# Patient Record
Sex: Female | Born: 1995
Health system: Southern US, Community
[De-identification: ages and names within clinical notes are randomized; demographics above are authoritative.]

---
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
## 2017-05-09 DIAGNOSIS — Z020 Encounter for examination for admission to educational institution: Secondary | ICD-10-CM | POA: Diagnosis not present

## 2017-05-09 DIAGNOSIS — J309 Allergic rhinitis, unspecified: Secondary | ICD-10-CM | POA: Diagnosis not present

## 2017-05-09 DIAGNOSIS — L700 Acne vulgaris: Secondary | ICD-10-CM | POA: Diagnosis not present

## 2017-05-16 DIAGNOSIS — Z23 Encounter for immunization: Secondary | ICD-10-CM | POA: Diagnosis not present

## 2017-06-07 DIAGNOSIS — D229 Melanocytic nevi, unspecified: Secondary | ICD-10-CM | POA: Diagnosis not present

## 2017-06-07 DIAGNOSIS — L720 Epidermal cyst: Secondary | ICD-10-CM | POA: Diagnosis not present

## 2017-06-07 DIAGNOSIS — L709 Acne, unspecified: Secondary | ICD-10-CM | POA: Diagnosis not present

## 2017-06-28 DIAGNOSIS — Z0184 Encounter for antibody response examination: Secondary | ICD-10-CM | POA: Diagnosis not present

## 2018-08-01 DIAGNOSIS — Z20828 Contact with and (suspected) exposure to other viral communicable diseases: Secondary | ICD-10-CM | POA: Diagnosis not present

## 2019-09-13 ENCOUNTER — Other Ambulatory Visit: Payer: Self-pay | Admitting: Family Medicine

## 2019-09-13 ENCOUNTER — Other Ambulatory Visit (HOSPITAL_COMMUNITY)
Admission: RE | Admit: 2019-09-13 | Discharge: 2019-09-13 | Disposition: A | Discharge status: Home / Self Care | Payer: 59 | Source: Ambulatory Visit | Attending: Family Medicine | Admitting: Family Medicine

## 2019-09-13 DIAGNOSIS — Z124 Encounter for screening for malignant neoplasm of cervix: Secondary | ICD-10-CM | POA: Diagnosis present

## 2019-09-17 LAB — CYTOLOGY - PAP
Adequacy: ABSENT
Diagnosis: NEGATIVE

## 2019-11-05 ENCOUNTER — Other Ambulatory Visit: Payer: 59

## 2019-11-05 ENCOUNTER — Other Ambulatory Visit: Payer: Self-pay

## 2019-11-05 DIAGNOSIS — Z20822 Contact with and (suspected) exposure to covid-19: Secondary | ICD-10-CM

## 2019-11-07 LAB — SARS-COV-2, NAA 2 DAY TAT

## 2019-11-07 LAB — NOVEL CORONAVIRUS, NAA: SARS-CoV-2, NAA: NOT DETECTED

## 2020-10-10 LAB — HM PAP SMEAR: HM Pap smear: NEGATIVE

## 2021-07-31 ENCOUNTER — Encounter (HOSPITAL_COMMUNITY): Payer: Self-pay | Admitting: Emergency Medicine

## 2021-07-31 ENCOUNTER — Emergency Department (HOSPITAL_COMMUNITY)
Admission: EM | Admit: 2021-07-31 | Discharge: 2021-07-31 | Disposition: A | Discharge status: Home / Self Care | Payer: 59 | Attending: Emergency Medicine | Admitting: Emergency Medicine

## 2021-07-31 ENCOUNTER — Other Ambulatory Visit: Payer: Self-pay

## 2021-07-31 DIAGNOSIS — S6991XA Unspecified injury of right wrist, hand and finger(s), initial encounter: Secondary | ICD-10-CM | POA: Diagnosis present

## 2021-07-31 DIAGNOSIS — W268XXA Contact with other sharp object(s), not elsewhere classified, initial encounter: Secondary | ICD-10-CM | POA: Insufficient documentation

## 2021-07-31 DIAGNOSIS — S61212A Laceration without foreign body of right middle finger without damage to nail, initial encounter: Secondary | ICD-10-CM | POA: Insufficient documentation

## 2021-07-31 MED ORDER — TETANUS-DIPHTH-ACELL PERTUSSIS 5-2.5-18.5 LF-MCG/0.5 IM SUSY: 0.5000 mL | PREFILLED_SYRINGE | Freq: Once | INTRAMUSCULAR | Status: DC

## 2021-07-31 NOTE — ED Triage Notes (Signed)
Pt reported to ED for evaluation of injury to right third finger after accidentally cutting tip while using a slicing device at home. Some bleeding noted to site at time of triage. Pt able to flex and extend all digits on affected hand with no difficulty.

## 2021-07-31 NOTE — ED Provider Notes (Signed)
[facility] EMERGENCY DEPARTMENT Provider Note   CSN: [id number] Arrival date & time: 07/31/21  1951     History  Chief Complaint  Patient presents with   Finger Injury    Cynthia Williamson is a 26 y.o. female with no significant past medical history who is not up-to-date on tetanus who presents with concern for slicing very tip of right third finger with a mandolin.  Patient reports there was a lot of bleeding that she did not want to do.  Father attempted to place a liquid bandage with no success.  Endorses no nail injury, numbness, tingling.  HPI     Home Medications Prior to Admission medications   Not on File      Allergies    Sulfa antibiotics    Review of Systems   Review of Systems  Skin:  Positive for wound.  All other systems reviewed and are negative.   Physical Exam Updated Vital Signs BP 122/84   Pulse 86   Temp 98.4 F (36.9 C) (Oral)   Resp 18   SpO2 100%  Physical Exam Vitals and nursing note reviewed.  Constitutional:      General: She is not in acute distress.    Appearance: Normal appearance.  HENT:     Head: Normocephalic and atraumatic.  Eyes:     General:        Right eye: No discharge.        Left eye: No discharge.  Cardiovascular:     Rate and Rhythm: Normal rate and regular rhythm.     Pulses: Normal pulses.  Pulmonary:     Effort: Pulmonary effort is normal. No respiratory distress.  Musculoskeletal:        General: No deformity.  Skin:    General: Skin is warm and dry.     Capillary Refill: Capillary refill takes less than 2 seconds.     Comments: Very shallow laceration at tip of right third finger with no nailbed involvement.  No active bleeding at time my evaluation.  No signs of foreign body.  There is a total removal of very thin skin surface, no repairable laceration noted.  Neurological:     Mental Status: She is alert and oriented to person, place, and time.  Psychiatric:        Mood and Affect:  Mood normal.        Behavior: Behavior normal.     ED Results / Procedures / Treatments   Labs (all labs ordered are listed, but only abnormal results are displayed) Labs Reviewed - No data to display  EKG None  Radiology No results found.  Procedures Procedures    Medications Ordered in ED Medications  Tdap (BOOSTRIX) injection 0.5 mL (has no administration in time range)    ED Course/ Medical Decision Making/ A&P                           Medical Decision Making  This an overall well-appearing 26 year old female who presents with concern for slicing off the tip of her third finger on the right side.  On exam patient has very shallow complete removal of the tip of her third right finger.  There is no nailbed involvement.  No active bleeding at time my evaluation.  Discussed with patient that wound is likely to heal by secondary intention.  We will update her tetanus shot.  We will apply Xeroform dressing, patient instructed to  keep the dressing in place for 48 hours, for redressing at home.  Patient given information of hand surgeon for follow-up as needed for wound care.  She is discharged in stable condition at this time with some wound care supplies.  Encouraged to monitor for signs of infection.  She is neurovascularly intact throughout. Final Clinical Impression(s) / ED Diagnoses Final diagnoses:  Laceration of right middle finger without foreign body without damage to nail, initial encounter    Rx / DC Orders ED Discharge Orders     None         Olene Floss, PA-C 07/31/21 2016    Tanda Rockers A, DO 08/01/21 [id number]

## 2021-07-31 NOTE — Discharge Instructions (Signed)
Keep the dressing that we have placed today on for 48 hours before redressing.  Clean the wound thoroughly after dressing, apply thin layer of Polysporin, Neosporin, and then redress again, change dressing every 24 hours until wound completely healed.  Monitor for signs of infection including worsening redness, swelling, purulent drainage.  Please return to the emergency department if you are concerned about possible infection.  As this wound is very shallow I think that it would heal fine on its own with no intervention, however if you are concerned about the weight is healing, without signs of infection you can follow-up with a hand surgeon for further wound care instructions.

## 2022-04-06 ENCOUNTER — Other Ambulatory Visit: Payer: Self-pay

## 2022-04-06 ENCOUNTER — Emergency Department (HOSPITAL_BASED_OUTPATIENT_CLINIC_OR_DEPARTMENT_OTHER): Payer: Self-pay

## 2022-04-06 ENCOUNTER — Other Ambulatory Visit (HOSPITAL_BASED_OUTPATIENT_CLINIC_OR_DEPARTMENT_OTHER): Payer: Self-pay

## 2022-04-06 ENCOUNTER — Encounter (HOSPITAL_BASED_OUTPATIENT_CLINIC_OR_DEPARTMENT_OTHER): Payer: Self-pay | Admitting: Emergency Medicine

## 2022-04-06 ENCOUNTER — Emergency Department (HOSPITAL_BASED_OUTPATIENT_CLINIC_OR_DEPARTMENT_OTHER)
Admission: EM | Admit: 2022-04-06 | Discharge: 2022-04-06 | Disposition: A | Discharge status: Home / Self Care | Payer: Self-pay | Attending: Emergency Medicine | Admitting: Emergency Medicine

## 2022-04-06 DIAGNOSIS — R1013 Epigastric pain: Secondary | ICD-10-CM | POA: Insufficient documentation

## 2022-04-06 DIAGNOSIS — D649 Anemia, unspecified: Secondary | ICD-10-CM | POA: Insufficient documentation

## 2022-04-06 DIAGNOSIS — R519 Headache, unspecified: Secondary | ICD-10-CM | POA: Insufficient documentation

## 2022-04-06 DIAGNOSIS — R109 Unspecified abdominal pain: Secondary | ICD-10-CM

## 2022-04-06 DIAGNOSIS — R1031 Right lower quadrant pain: Secondary | ICD-10-CM | POA: Insufficient documentation

## 2022-04-06 LAB — CBC WITH DIFFERENTIAL/PLATELET
Abs Immature Granulocytes: 0.02 10*3/uL (ref 0.00–0.07)
Basophils Absolute: 0 10*3/uL (ref 0.0–0.1)
Basophils Relative: 0 %
Eosinophils Absolute: 0 10*3/uL (ref 0.0–0.5)
Eosinophils Relative: 0 %
HCT: 36.4 % (ref 36.0–46.0)
Hemoglobin: 11.5 g/dL — ABNORMAL LOW (ref 12.0–15.0)
Immature Granulocytes: 0 %
Lymphocytes Relative: 22 %
Lymphs Abs: 1.1 10*3/uL (ref 0.7–4.0)
MCH: 25.7 pg — ABNORMAL LOW (ref 26.0–34.0)
MCHC: 31.6 g/dL (ref 30.0–36.0)
MCV: 81.3 fL (ref 80.0–100.0)
Monocytes Absolute: 0.5 10*3/uL (ref 0.1–1.0)
Monocytes Relative: 9 %
Neutro Abs: 3.4 10*3/uL (ref 1.7–7.7)
Neutrophils Relative %: 69 %
Platelets: 346 10*3/uL (ref 150–400)
RBC: 4.48 MIL/uL (ref 3.87–5.11)
RDW: 16.3 % — ABNORMAL HIGH (ref 11.5–15.5)
WBC: 5 10*3/uL (ref 4.0–10.5)
nRBC: 0 % (ref 0.0–0.2)

## 2022-04-06 LAB — URINALYSIS, ROUTINE W REFLEX MICROSCOPIC
Bilirubin Urine: NEGATIVE
Glucose, UA: NEGATIVE mg/dL
Hgb urine dipstick: NEGATIVE
Leukocytes,Ua: NEGATIVE
Nitrite: NEGATIVE
Protein, ur: 30 mg/dL — AB
Specific Gravity, Urine: 1.032 — ABNORMAL HIGH (ref 1.005–1.030)
pH: 6.5 (ref 5.0–8.0)

## 2022-04-06 LAB — COMPREHENSIVE METABOLIC PANEL
ALT: 11 U/L (ref 0–44)
AST: 19 U/L (ref 15–41)
Albumin: 4 g/dL (ref 3.5–5.0)
Alkaline Phosphatase: 45 U/L (ref 38–126)
Anion gap: 9 (ref 5–15)
BUN: 9 mg/dL (ref 6–20)
CO2: 27 mmol/L (ref 22–32)
Calcium: 9.2 mg/dL (ref 8.9–10.3)
Chloride: 100 mmol/L (ref 98–111)
Creatinine, Ser: 0.85 mg/dL (ref 0.44–1.00)
GFR, Estimated: >60 mL/min (ref >60)
Glucose, Bld: 73 mg/dL (ref 70–99)
Potassium: 3.5 mmol/L (ref 3.5–5.1)
Sodium: 136 mmol/L (ref 135–145)
Total Bilirubin: 0.5 mg/dL (ref 0.3–1.2)
Total Protein: 7.9 g/dL (ref 6.5–8.1)

## 2022-04-06 LAB — LIPASE, BLOOD: Lipase: <10 U/L — ABNORMAL LOW (ref 11–51)

## 2022-04-06 LAB — PREGNANCY, URINE: Preg Test, Ur: NEGATIVE

## 2022-04-06 MED ORDER — MORPHINE SULFATE (PF) 4 MG/ML IV SOLN
6.0000 mg | Freq: Once | INTRAVENOUS | Status: AC
  Administered 2022-04-06: 6 mg via INTRAVENOUS
  Filled 2022-04-06: qty 2

## 2022-04-06 MED ORDER — KETOROLAC TROMETHAMINE 30 MG/ML IJ SOLN: 30.0000 mg | Freq: Once | INTRAMUSCULAR | Status: DC

## 2022-04-06 MED ORDER — ONDANSETRON HCL 4 MG/2ML IJ SOLN
4.0000 mg | Freq: Once | INTRAMUSCULAR | Status: AC
  Administered 2022-04-06: 4 mg via INTRAVENOUS
  Filled 2022-04-06: qty 2

## 2022-04-06 MED ORDER — IOHEXOL 300 MG/ML  SOLN
100.0000 mL | Freq: Once | INTRAMUSCULAR | Status: AC | PRN
  Administered 2022-04-06: 100 mL via INTRAVENOUS

## 2022-04-06 MED ORDER — KETOROLAC TROMETHAMINE 30 MG/ML IJ SOLN
30.0000 mg | Freq: Once | INTRAMUSCULAR | Status: AC
  Administered 2022-04-06: 30 mg via INTRAVENOUS
  Filled 2022-04-06: qty 1

## 2022-04-06 MED ORDER — SODIUM CHLORIDE 0.9 % IV BOLUS
1000.0000 mL | Freq: Once | INTRAVENOUS | Status: AC
  Administered 2022-04-06: 1000 mL via INTRAVENOUS

## 2022-04-06 NOTE — ED Triage Notes (Addendum)
Pt states started feeling bad on Sunday. Has throbbing abd pain/ cramping. Took tylenol and slept. Has had diarrhea ever since. She states she is not really eating due to fear of stomach hurting. Has not taken anything for it- except zofran. Pt is drinking water. She states mother also has little bit of diarrhea but not the pain or lightheadedness when standing.

## 2022-04-06 NOTE — ED Provider Notes (Signed)
[facility] Provider Note   CSN: [id number] Arrival date & time: 04/06/22  1221     History  Chief Complaint  Patient presents with   Abdominal Pain   HPI Cynthia Williamson is a 27 y.o. female presenting for abdominal pain.  Started Sunday night.  Pain located in the epigastric region.  It is constant, nonradiating and feels like pressure.  States she has had some nausea and diarrhea started yesterday.  Diarrhea is nonbloody.  No vomiting.  Denies urinary changes.  Denies recent alcohol consumption.  Has been taking Tylenol as well for symptoms.  Also endorsing headache that started Sunday night as well.  Headache is all over her head.  It is not the worst headache she ever had in her life but has been persistent.  States she is still able to tolerate fluid intake but her appetite has been poor in the last 48 hours.   Abdominal Pain      Home Medications Prior to Admission medications   Not on File      Allergies    Sulfa antibiotics    Review of Systems   Review of Systems  Gastrointestinal:  Positive for abdominal pain.    Physical Exam   Vitals:   04/06/22 1229  BP: 126/79  Pulse: 98  Resp: 16  Temp: 98.2 F (36.8 C)  SpO2: 98%    CONSTITUTIONAL:  well-appearing, NAD NEURO:  GCS 15. Speech is goal oriented. No deficits appreciated to CN III-XII; symmetric eyebrow raise, no facial drooping, tongue midline. Patient has equal grip strength bilaterally with 5/5 strength against resistance in all major muscle groups bilaterally. Sensation to light touch intact. Patient moves extremities without ataxia. Normal finger-nose-finger. Patient ambulatory with steady gait. EYES:  eyes equal and reactive ENT/NECK:  Supple, no stridor, No JVD CARDIO:  regular rate and rhythm, appears well-perfused  PULM:  No respiratory distress, CTAB GI/GU:  non-distended, epigastric and right lower quadrant tenderness MSK/SPINE:  No gross  deformities, no edema, moves all extremities  SKIN:  no rash, atraumatic   *Additional and/or pertinent findings included in MDM below   ED Results / Procedures / Treatments   Labs (all labs ordered are listed, but only abnormal results are displayed) Labs Reviewed  CBC WITH DIFFERENTIAL/PLATELET - Abnormal; Notable for the following components:      Result Value   Hemoglobin 11.5 (*)    MCH 25.7 (*)    RDW 16.3 (*)    All other components within normal limits  LIPASE, BLOOD - Abnormal; Notable for the following components:   Lipase <10 (*)    All other components within normal limits  URINALYSIS, ROUTINE W REFLEX MICROSCOPIC - Abnormal; Notable for the following components:   APPearance HAZY (*)    Specific Gravity, Urine 1.032 (*)    Ketones, ur TRACE (*)    Protein, ur 30 (*)    Bacteria, UA RARE (*)    All other components within normal limits  COMPREHENSIVE METABOLIC PANEL  PREGNANCY, URINE    EKG EKG Interpretation  Date/Time:  Tuesday April 06 2022 13:22:48 EST Ventricular Rate:  84 PR Interval:  142 QRS Duration: 82 QT Interval:  321 QTC Calculation: 380 R Axis:   61 Text Interpretation: Sinus rhythm Normal ECG Confirmed by Blanchie Dessert [phone]) on 04/06/2022 1:36:49 PM  Radiology CT Abdomen Pelvis W Contrast  Result Date: 04/06/2022 CLINICAL DATA:  Abdominal pain, diarrhea EXAM: CT ABDOMEN AND PELVIS WITH CONTRAST TECHNIQUE:  Multidetector CT imaging of the abdomen and pelvis was performed using the standard protocol following bolus administration of intravenous contrast. RADIATION DOSE REDUCTION: This exam was performed according to the departmental dose-optimization program which includes automated exposure control, adjustment of the mA and/or kV according to patient size and/or use of iterative reconstruction technique. CONTRAST:  179m OMNIPAQUE IOHEXOL 300 MG/ML  SOLN COMPARISON:  None Available. FINDINGS: Lower chest: Small linear densities in left lower  lung fields may suggest minimal scarring. Small pericardial effusion is present. Hepatobiliary: No focal abnormalities are seen in liver. Gallbladder is unremarkable. Pancreas: No focal abnormalities are seen. Spleen: Unremarkable. Adrenals/Urinary Tract: Adrenals are unremarkable. There is no hydronephrosis. There are no renal or ureteral stones. Urinary bladder is not distended. Stomach/Bowel: Stomach is unremarkable. There is mild to moderate dilation of a small bowel loop in left lower abdomen. The dilated loop measures up to 3.3 cm in diameter. There is fluid in the lumen. There is no significant focal wall thickening. Appendix is not dilated. There is no significant wall thickening in colon. Vascular/Lymphatic: No significant lymphadenopathy is seen. Reproductive: Uterus is unremarkable. Small amount of free fluid is seen in cul-de-sac. Other: There is no pneumoperitoneum. Small umbilical hernia containing fat is seen. Musculoskeletal: No acute findings are seen. IMPRESSION: There is no evidence of intestinal obstruction or pneumoperitoneum. Appendix is not dilated. There is no hydronephrosis. There is dilation of small bowel loop in left lower abdomen measuring up to 3.3 cm in diameter. There is fluid in the lumen of this dilated small bowel loops. Findings may suggest localized ileus or nonspecific enteritis. Small amount of free fluid in pelvis may be due to physiological rupture of ovarian follicle or related to enteritis. Small pericardial effusion. Other findings as described in the body of the report. Electronically Signed   By: PElmer PickerM.D.   On: 04/06/2022 14:59    Procedures Procedures    Medications Ordered in ED Medications  ketorolac (TORADOL) 30 MG/ML injection 30 mg (has no administration in time range)  sodium chloride 0.9 % bolus 1,000 mL (0 mLs Intravenous Stopped 04/06/22 1424)  ondansetron (ZOFRAN) injection 4 mg (4 mg Intravenous Given 04/06/22 1325)  morphine (PF) 4  MG/ML injection 6 mg (6 mg Intravenous Given 04/06/22 1322)  iohexol (OMNIPAQUE) 300 MG/ML solution 100 mL (100 mLs Intravenous Contrast Given 04/06/22 1441)    ED Course/ Medical Decision Making/ A&P                             Medical Decision Making Amount and/or Complexity of Data Reviewed Labs: ordered. Radiology: ordered.  Risk Prescription drug management.   Initial Impression and Ddx 27yo who is well appearing and HD stable presenting for abdominal pain. Exam notable for LLQ tenderness. Ddx, includes SBO, bowel perforation, diverticulitis, appendicitis, ACS, ectopic pregnancy. Ddx for headache,   Patient PMH that increases complexity of ED encounter:  none  Interpretation of Diagnostics I independent reviewed and interpreted the labs as followed: anemia  - I independently visualized the following imaging with scope of interpretation limited to determining acute life threatening conditions related to emergency care: CT abdomen and pelvis, which revealed small pericardial effusion, findings suggestive of enteritis versus small ileus  -I personally reviewed and interpreted EKG which revealed normal sinus rhythm  Patient Reassessment and Ultimate Disposition/Management Volume resuscitated with normal saline bolus.  Treated abdominal pain and headache with morphine and Toradol.  Treated nausea with  Zofran.  After treatment patient states she felt much better.  Was concerned primarily for appendicitis given her RLQ tenderness but fortunately CT scan was negative.  Did reveal findings of a small pericardial effusion and findings suggestive of enteritis versus small ileus.  Shared these findings with patient.  Have low suspicion for cardiac tamponade given that patient is not hypotensive, there is no JVD, and heart sounds are not distant.  Advised her to follow-up with her PCP for ongoing abdominal pain and incidental findings on CT scan.  Headache likely mild migraine or tension type  headache.  Symptoms are consistent with [facility] or hypertensive emergency.  Patient management required discussion with the following services or consulting groups:  None  Complexity of Problems Addressed Acute complicated illness or Injury  Additional Data Reviewed and Analyzed Further history obtained from: Past medical history and medications listed in the EMR  Patient Encounter Risk Assessment None         Final Clinical Impression(s) / ED Diagnoses Final diagnoses:  Abdominal pain, unspecified abdominal location  Nonintractable headache, unspecified chronicity pattern, unspecified headache type    Rx / DC Orders ED Discharge Orders     None         Harriet Pho, PA-C 04/06/22 1540    Blanchie Dessert, MD 04/07/22 2200

## 2022-04-06 NOTE — Discharge Instructions (Signed)
Evaluation today for headache and abdominal pain was overall reassuring.  CT of your abdomen and pelvis was overall reassuring.  Recommend that you follow-up with your PCP for the incidental finding of the small pericardial effusion and findings suggestive of ileus versus enteritis.  At this time nothing to treat but follow-up is warranted pressure.  If you have worsening abdominal pain, new chest pain or shortness of breath, or any other concerning symptom please return emergency department further evaluation.

## 2022-05-06 ENCOUNTER — Encounter: Payer: Self-pay | Admitting: Family Medicine

## 2022-05-06 ENCOUNTER — Ambulatory Visit (INDEPENDENT_AMBULATORY_CARE_PROVIDER_SITE_OTHER): Payer: 59 | Admitting: Family Medicine

## 2022-05-06 VITALS — BP 124/82 | HR 86 | Temp 98.2°F | Resp 18 | Ht 65.35 in | Wt 352.0 lb

## 2022-05-06 DIAGNOSIS — Z1329 Encounter for screening for other suspected endocrine disorder: Secondary | ICD-10-CM

## 2022-05-06 DIAGNOSIS — Z113 Encounter for screening for infections with a predominantly sexual mode of transmission: Secondary | ICD-10-CM | POA: Diagnosis not present

## 2022-05-06 DIAGNOSIS — Z7689 Persons encountering health services in other specified circumstances: Secondary | ICD-10-CM

## 2022-05-06 DIAGNOSIS — Z6841 Body Mass Index (BMI) 40.0 and over, adult: Secondary | ICD-10-CM | POA: Insufficient documentation

## 2022-05-06 DIAGNOSIS — J309 Allergic rhinitis, unspecified: Secondary | ICD-10-CM | POA: Insufficient documentation

## 2022-05-06 DIAGNOSIS — Z1159 Encounter for screening for other viral diseases: Secondary | ICD-10-CM | POA: Diagnosis not present

## 2022-05-06 DIAGNOSIS — Z130 Encounter for screening for diseases of the blood and blood-forming organs and certain disorders involving the immune mechanism: Secondary | ICD-10-CM

## 2022-05-06 DIAGNOSIS — Z13228 Encounter for screening for other metabolic disorders: Secondary | ICD-10-CM | POA: Diagnosis not present

## 2022-05-06 NOTE — Progress Notes (Signed)
New Patient Office Visit  Subjective    Patient ID: Cynthia Williamson, female    DOB: 1995/08/22  Age: 27 y.o. MRN: [id number]  CC:  Chief Complaint  Patient presents with   Establish Care    HPI Cynthia Williamson presents to establish care.  Cynthia Williamson does not have any significant medical history, although Cynthia Williamson was in the hospital at beginning of March for abdominal pain with negative workup.  Cynthia Williamson has an allergy to pork.  Currently up-to-date on all vaccinations but declines flu and COVID, Pap smear up-to-date in 2022.  Family history positive for diabetes.  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  Her mom and her dad offer a good support system.  Cynthia Williamson does not follow any particular diet or exercise plan other than avoiding pork.  Cynthia Williamson is not currently sexually active.  No outpatient encounter medications on file as of 05/06/2022.   No facility-administered encounter medications on file as of 05/06/2022.    History reviewed. No pertinent past medical history.  History reviewed. No pertinent surgical history.  History reviewed. No pertinent family history.  Social History   Socioeconomic History   Marital status: Single    Spouse name: Not on file   Number of children: Not on file   Years of education: Not on file   Highest education level: Not on file  Occupational History   Occupation: Adjunct Professor    Employer: [employer]  Tobacco Use   Smoking status: Never    Passive exposure: Never   Smokeless tobacco: Never  Vaping Use   Vaping Use: Never used  Substance and Sexual Activity   Alcohol use: No   Drug use: No   Sexual activity: Never  Other Topics Concern   Not on file  Social History Narrative   Not on file   Social Determinants of Health   Financial Resource Strain: Not on file  Food Insecurity: Not on file  Transportation Needs: Not on file  Physical Activity: Not on file  Stress: Not on file  Social Connections: Not on  file  Intimate Partner Violence: Not on file    Review of Systems  Constitutional:  Negative for chills, fever and malaise/fatigue.  HENT:  Negative for congestion and hearing loss.   Eyes:  Negative for blurred vision and double vision.  Respiratory:  Negative for cough and shortness of breath.   Cardiovascular:  Negative for chest pain, palpitations and leg swelling.  Gastrointestinal:  Negative for abdominal pain, constipation, diarrhea and heartburn.  Genitourinary:  Negative for frequency and urgency.  Musculoskeletal:  Negative for myalgias and neck pain.  Neurological:  Negative for headaches.  Endo/Heme/Allergies:  Negative for polydipsia.  Psychiatric/Behavioral:  Negative for depression. The patient is not nervous/anxious.         Objective    BP 124/82 (BP Location: Left Arm, Patient Position: Sitting, Cuff Size: Large)   Pulse 86   Temp 98.2 F (36.8 C) (Oral)   Resp 18   Ht 5' 5.35" (1.66 m)   Wt (!) 352 lb (159.7 kg)   LMP 04/11/2022 (Exact Date)   SpO2 100%   BMI 57.94 kg/m   Physical Exam Constitutional:      General: Cynthia Williamson is not in acute distress.    Appearance: Normal appearance. Cynthia Williamson is not ill-appearing.  HENT:     Head: Normocephalic and atraumatic.     Right Ear: Tympanic membrane, ear canal and external  ear normal.     Left Ear: Tympanic membrane, ear canal and external ear normal.     Nose: Nose normal. No congestion or rhinorrhea.     Mouth/Throat:     Mouth: Mucous membranes are moist.     Pharynx: Oropharynx is clear. No oropharyngeal exudate or posterior oropharyngeal erythema.  Eyes:     General:        Right eye: No discharge.        Left eye: No discharge.     Conjunctiva/sclera: Conjunctivae normal.     Pupils: Pupils are equal, round, and reactive to light.  Cardiovascular:     Rate and Rhythm: Normal rate and regular rhythm.     Pulses: Normal pulses.     Heart sounds: No murmur heard.    No friction rub. No gallop.  Pulmonary:      Effort: Pulmonary effort is normal. No respiratory distress.     Breath sounds: Normal breath sounds. No wheezing, rhonchi or rales.  Musculoskeletal:     Cervical back: Normal range of motion.  Skin:    General: Skin is warm and dry.  Neurological:     General: No focal deficit present.     Mental Status: Cynthia Williamson is alert and oriented to person, place, and time. Mental status is at baseline.  Psychiatric:        Mood and Affect: Mood normal.        Thought Content: Thought content normal.        Judgment: Judgment normal.        Assessment & Plan:  Encounter to establish care -     CBC; Future -     Comprehensive metabolic panel; Future -     Lipid panel; Future -     Hemoglobin A1c; Future -     Hepatitis C antibody; Future -     HIV Antibody (routine testing w rflx); Future  Screening for endocrine, metabolic and immunity disorder -     CBC; Future -     Comprehensive metabolic panel; Future -     Lipid panel; Future -     Hemoglobin A1c; Future -     Hepatitis C antibody; Future -     HIV Antibody (routine testing w rflx); Future  Screening for viral disease -     Hepatitis C antibody; Future -     HIV Antibody (routine testing w rflx); Future  Routine screening for STI (sexually transmitted infection) -     Chlamydia/Gonococcus/Trichomonas, NAA; Future    Return in about 2 months (around 07/06/2022) for annual physical.  If still asymptomatic, will continue with watchful waiting.  Otherwise, reasonable to consider referral to GI and/or cardiology given episode of abdominal pain and small pericardial effusion plus enteritis versus small ileus found on CT during hospital workup.  Patient verbalized understanding and is agreeable to this plan.  I spent 45 minutes on the day of the encounter to include pre-visit record review, face-to-face time with the patient and post visit ordering of test.  Velva Harman, PA

## 2022-07-09 ENCOUNTER — Encounter: Payer: 59 | Admitting: Family Medicine

## 2022-07-16 ENCOUNTER — Encounter: Payer: 59 | Admitting: Family Medicine

## 2022-07-27 ENCOUNTER — Encounter: Payer: Self-pay | Admitting: Family Medicine

## 2022-07-27 ENCOUNTER — Ambulatory Visit (INDEPENDENT_AMBULATORY_CARE_PROVIDER_SITE_OTHER): Payer: 59 | Admitting: Family Medicine

## 2022-07-27 VITALS — BP 127/79 | HR 85 | Resp 18 | Ht 65.35 in | Wt 352.0 lb

## 2022-07-27 DIAGNOSIS — Z1159 Encounter for screening for other viral diseases: Secondary | ICD-10-CM

## 2022-07-27 DIAGNOSIS — D649 Anemia, unspecified: Secondary | ICD-10-CM | POA: Diagnosis not present

## 2022-07-27 DIAGNOSIS — Z6841 Body Mass Index (BMI) 40.0 and over, adult: Secondary | ICD-10-CM | POA: Diagnosis not present

## 2022-07-27 DIAGNOSIS — Z0000 Encounter for general adult medical examination without abnormal findings: Secondary | ICD-10-CM | POA: Diagnosis not present

## 2022-07-27 NOTE — Progress Notes (Signed)
Complete physical exam  Patient: Cynthia Williamson   DOB: 1995/11/20   27 y.o. Female  MRN: [id number]  Subjective:    Chief Complaint  Patient presents with   Annual Exam    fasting    Cynthia Williamson is a 27 y.o. female who presents today for a complete physical exam. She reports consuming a general diet, avoiding pork due to her allergy. The patient does not participate in regular exercise at present. However, she does have her 48 year old nephew with her for the summer and stays busy. She generally feels well. She reports sleeping well. She does not have additional problems to discuss today. Her goal for the next year is to lose weight. Abdominal pain has resolved.   Most recent fall risk assessment:    07/27/2022    9:21 AM  Fall Risk   Falls in the past year? 0  Number falls in past yr: 0  Injury with Fall? 0  Risk for fall due to : No Fall Risks  Follow up Falls evaluation completed     Most recent depression and anxiety screenings:    07/27/2022    9:22 AM 05/06/2022    2:00 PM  PHQ 2/9 Scores  PHQ - 2 Score 0 0  PHQ- 9 Score 0       07/27/2022    9:22 AM 05/06/2022    2:00 PM  GAD 7 : Generalized Anxiety Score  Nervous, Anxious, on Edge 0 0  Control/stop worrying 0 0  Worry too much - different things 0 0  Trouble relaxing 0 0  Restless 0 0  Easily annoyed or irritable 0 0  Afraid - awful might happen 0 0  Total GAD 7 Score 0 0  Anxiety Difficulty Not difficult at all Not difficult at all    Patient Active Problem List   Diagnosis Date Noted   Body mass index (BMI) 50.0-59.9, adult (HCC) 05/06/2022   Allergic rhinitis 05/06/2022    History reviewed. No pertinent surgical history. Social History   Tobacco Use   Smoking status: Never    Passive exposure: Never   Smokeless tobacco: Never  Vaping Use   Vaping Use: Never used  Substance Use Topics   Alcohol use: No   Drug use: No   History reviewed. No pertinent family history. Allergies  Allergen  Reactions   Pork-Derived Products    Sulfa Antibiotics      Patient Care Team: Melida Quitter, PA as PCP - General (Family Medicine)   No outpatient medications prior to visit.   No facility-administered medications prior to visit.    Review of Systems  Constitutional:  Negative for chills, fever and malaise/fatigue.  HENT:  Negative for congestion and hearing loss.   Eyes:  Negative for blurred vision and double vision.  Respiratory:  Negative for cough and shortness of breath.   Cardiovascular:  Negative for chest pain, palpitations and leg swelling.  Gastrointestinal:  Negative for abdominal pain, constipation, diarrhea and heartburn.  Genitourinary:  Negative for frequency and urgency.  Musculoskeletal:  Negative for myalgias and neck pain.  Neurological:  Negative for headaches.  Endo/Heme/Allergies:  Negative for polydipsia.  Psychiatric/Behavioral:  Negative for depression. The patient is not nervous/anxious and does not have insomnia.       Objective:    BP 127/79 (BP Location: Left Arm, Patient Position: Sitting, Cuff Size: Large)   Pulse 85   Resp 18   Ht 5' 5.35" (1.66 m)  Wt (!) 352 lb (159.7 kg)   LMP 07/26/2022 (Exact Date)   SpO2 99%   BMI 57.95 kg/m    Physical Exam Constitutional:      General: She is not in acute distress.    Appearance: Normal appearance.  HENT:     Head: Normocephalic and atraumatic.     Right Ear: Tympanic membrane, ear canal and external ear normal. There is no impacted cerumen.     Left Ear: Tympanic membrane, ear canal and external ear normal. There is no impacted cerumen.     Nose: Nose normal. No congestion or rhinorrhea.     Mouth/Throat:     Mouth: Mucous membranes are moist.     Pharynx: No oropharyngeal exudate or posterior oropharyngeal erythema.  Eyes:     Extraocular Movements: Extraocular movements intact.     Conjunctiva/sclera: Conjunctivae normal.     Pupils: Pupils are equal, round, and reactive to  light.  Neck:     Thyroid: No thyroid mass, thyromegaly or thyroid tenderness.  Cardiovascular:     Rate and Rhythm: Normal rate and regular rhythm.     Heart sounds: Normal heart sounds.  Pulmonary:     Effort: Pulmonary effort is normal.     Breath sounds: Normal breath sounds.  Abdominal:     General: Abdomen is flat. Bowel sounds are normal.  Musculoskeletal:        General: Normal range of motion.     Cervical back: Normal range of motion and neck supple.  Lymphadenopathy:     Cervical: No cervical adenopathy.  Skin:    General: Skin is warm and dry.  Neurological:     Mental Status: She is alert and oriented to person, place, and time.     Cranial Nerves: No cranial nerve deficit.     Motor: No weakness.     Deep Tendon Reflexes: Reflexes normal.  Psychiatric:        Mood and Affect: Mood normal.       Assessment & Plan:    Routine Health Maintenance and Physical Exam  Immunization History  Administered Date(s) Administered   DTaP 09/03/1997, 11/04/1997, 12/18/1997, 09/16/1998, 03/22/2002   HIB (PRP-OMP) 09/03/1997, 11/04/1997, 09/16/1998, 03/22/2002   HIB (PRP-T) 09/03/1997, 11/04/1997, 09/16/1998, 03/22/2002   HPV 9-valent 07/14/2015, 09/15/2015, 01/15/2016   Hepatitis B, ADULT 1997/12/15, 07/23/1997, 03/19/1998   Hepatitis B, PED/ADOLESCENT 1997/05/03, 07/23/1997, 03/19/1998   MMR 06/16/1998, 03/22/2002   Meningococcal Conjugate 07/09/2013, 09/13/2019   OPV 09/03/1997, 11/04/1997, 03/19/1998, 03/22/2002   Td 05/16/2017   Tdap 09/04/2008, 09/13/2019   Varicella 06/16/1998, 09/04/2008, 05/16/2017    Health Maintenance  Topic Date Due   COVID-19 Vaccine (1) Never done   HIV Screening  Never done   Hepatitis C Screening  Never done   PAP-Cervical Cytology Screening  09/13/2022   PAP SMEAR-Modifier  09/13/2022   INFLUENZA VACCINE  09/02/2022   DTaP/Tdap/Td (9 - Td or Tdap) 09/12/2029   HPV VACCINES  Completed   Most recent pap at [facility] in 2022, requesting  copy of results. Will be due in 2025 at annual physical appt. Agreeable to completing HIV and hepatitis C screenings with labs today as they were not drawn after establishing care in April.  Discussed health benefits of physical activity, and encouraged her to engage in regular exercise appropriate for her age and condition.  Wellness examination -     CBC with Differential/Platelet; Future -     Comprehensive metabolic panel; Future -  Hemoglobin A1c; Future -     Lipid panel; Future -     VITAMIN D 25 Hydroxy (Vit-D Deficiency, Fractures); Future -     TSH; Future -     T4, free; Future  Body mass index (BMI) 50.0-59.9, adult (HCC) -     CBC with Differential/Platelet; Future -     Comprehensive metabolic panel; Future -     Hemoglobin A1c; Future -     Lipid panel; Future -     VITAMIN D 25 Hydroxy (Vit-D Deficiency, Fractures); Future -     TSH; Future -     T4, free; Future  Screening for viral disease -     Hepatitis C antibody; Future -     HIV Antibody (routine testing w rflx); Future  Anemia, unspecified type -     CBC with Differential/Platelet; Future -     Comprehensive metabolic panel; Future  Abdominal pain has resolved, continue watchful waiting. If needed in the future, can repeat imaging or refer to GI.  Return in about 1 year (around 07/27/2023) for annual physical + pap smear, fasting blood work 1 week before.     Melida Quitter, PA

## 2022-07-27 NOTE — Patient Instructions (Signed)
The Obesity Guide with [employer] is the awesome podcast that I was telling you about!  I hope that you enjoy listening to it. Please let me know if you have any questions or need any support, I am here to help you be the happiest, healthiest version of yourself :)

## 2022-07-28 ENCOUNTER — Other Ambulatory Visit: Payer: Self-pay | Admitting: Family Medicine

## 2022-07-28 ENCOUNTER — Other Ambulatory Visit (HOSPITAL_BASED_OUTPATIENT_CLINIC_OR_DEPARTMENT_OTHER): Payer: Self-pay

## 2022-07-28 DIAGNOSIS — E559 Vitamin D deficiency, unspecified: Secondary | ICD-10-CM

## 2022-07-28 DIAGNOSIS — D649 Anemia, unspecified: Secondary | ICD-10-CM

## 2022-07-28 LAB — LIPID PANEL
Chol/HDL Ratio: 4.1 ratio (ref 0.0–4.4)
Cholesterol, Total: 211 mg/dL — ABNORMAL HIGH (ref 100–199)
HDL: 52 mg/dL (ref >39)
LDL Chol Calc (NIH): 147 mg/dL — ABNORMAL HIGH (ref 0–99)
Triglycerides: 70 mg/dL (ref 0–149)
VLDL Cholesterol Cal: 12 mg/dL (ref 5–40)

## 2022-07-28 LAB — COMPREHENSIVE METABOLIC PANEL
ALT: 11 IU/L (ref 0–32)
AST: 18 IU/L (ref 0–40)
Albumin: 4.2 g/dL (ref 4.0–5.0)
Alkaline Phosphatase: 73 IU/L (ref 44–121)
BUN/Creatinine Ratio: 14 (ref 9–23)
BUN: 10 mg/dL (ref 6–20)
Bilirubin Total: <0.2 mg/dL (ref 0.0–1.2)
CO2: 20 mmol/L (ref 20–29)
Calcium: 9.9 mg/dL (ref 8.7–10.2)
Chloride: 103 mmol/L (ref 96–106)
Creatinine, Ser: 0.69 mg/dL (ref 0.57–1.00)
Globulin, Total: 2.8 g/dL (ref 1.5–4.5)
Glucose: 86 mg/dL (ref 70–99)
Potassium: 4.8 mmol/L (ref 3.5–5.2)
Sodium: 138 mmol/L (ref 134–144)
Total Protein: 7 g/dL (ref 6.0–8.5)
eGFR: 123 mL/min/{1.73_m2} (ref >59)

## 2022-07-28 LAB — CBC WITH DIFFERENTIAL/PLATELET
Basophils Absolute: 0 10*3/uL (ref 0.0–0.2)
Basos: 1 %
EOS (ABSOLUTE): 0.1 10*3/uL (ref 0.0–0.4)
Eos: 2 %
Hematocrit: 34.2 % (ref 34.0–46.6)
Hemoglobin: 10.7 g/dL — ABNORMAL LOW (ref 11.1–15.9)
Immature Grans (Abs): 0 10*3/uL (ref 0.0–0.1)
Immature Granulocytes: 0 %
Lymphocytes Absolute: 1.6 10*3/uL (ref 0.7–3.1)
Lymphs: 25 %
MCH: 25.1 pg — ABNORMAL LOW (ref 26.6–33.0)
MCHC: 31.3 g/dL — ABNORMAL LOW (ref 31.5–35.7)
MCV: 80 fL (ref 79–97)
Monocytes Absolute: 0.4 10*3/uL (ref 0.1–0.9)
Monocytes: 6 %
Neutrophils Absolute: 4.4 10*3/uL (ref 1.4–7.0)
Neutrophils: 66 %
Platelets: 435 10*3/uL (ref 150–450)
RBC: 4.27 x10E6/uL (ref 3.77–5.28)
RDW: 16.3 % — ABNORMAL HIGH (ref 11.7–15.4)
WBC: 6.6 10*3/uL (ref 3.4–10.8)

## 2022-07-28 LAB — TSH: TSH: 2.67 u[IU]/mL (ref 0.450–4.500)

## 2022-07-28 LAB — T4, FREE: Free T4: 1.23 ng/dL (ref 0.82–1.77)

## 2022-07-28 LAB — HEMOGLOBIN A1C
Est. average glucose Bld gHb Est-mCnc: 111 mg/dL
Hgb A1c MFr Bld: 5.5 % (ref 4.8–5.6)

## 2022-07-28 LAB — VITAMIN D 25 HYDROXY (VIT D DEFICIENCY, FRACTURES): Vit D, 25-Hydroxy: 7.8 ng/mL — ABNORMAL LOW (ref 30.0–100.0)

## 2022-07-28 LAB — HEPATITIS C ANTIBODY: Hep C Virus Ab: NONREACTIVE

## 2022-07-28 LAB — HIV ANTIBODY (ROUTINE TESTING W REFLEX): HIV Screen 4th Generation wRfx: NONREACTIVE

## 2022-07-28 MED ORDER — VITAMIN D (ERGOCALCIFEROL) 1.25 MG (50000 UNIT) PO CAPS
50000.0000 [IU] | ORAL_CAPSULE | ORAL | 0 refills | Status: DC
  Filled 2022-07-28: qty 4, 28d supply, fill #0

## 2022-08-02 ENCOUNTER — Encounter: Payer: Self-pay | Admitting: Family Medicine

## 2022-08-07 ENCOUNTER — Other Ambulatory Visit (HOSPITAL_BASED_OUTPATIENT_CLINIC_OR_DEPARTMENT_OTHER): Payer: Self-pay

## 2022-09-21 ENCOUNTER — Telehealth: Payer: Self-pay

## 2022-09-21 DIAGNOSIS — E559 Vitamin D deficiency, unspecified: Secondary | ICD-10-CM

## 2022-09-21 MED ORDER — VITAMIN D (ERGOCALCIFEROL) 1.25 MG (50000 UNIT) PO CAPS: 50000.0000 [IU] | ORAL_CAPSULE | ORAL | 0 refills | Status: AC

## 2022-09-21 NOTE — Telephone Encounter (Signed)
Script has been resent

## 2022-09-21 NOTE — Addendum Note (Signed)
Addended by: Tonny Bollman on: 09/21/2022 11:59 AM   Modules accepted: Orders

## 2022-09-21 NOTE — Telephone Encounter (Signed)
Pt is requesting that the Vit D rx be transferred to [id number] - [place], [place] - [address] [[id number]]   Pt never pick up that Rx.

## 2023-03-10 ENCOUNTER — Encounter: Payer: Self-pay | Admitting: Family Medicine

## 2023-07-28 ENCOUNTER — Encounter: Payer: 59 | Admitting: Family Medicine

## 2023-10-10 ENCOUNTER — Other Ambulatory Visit: Payer: Self-pay

## 2023-10-10 ENCOUNTER — Other Ambulatory Visit (HOSPITAL_BASED_OUTPATIENT_CLINIC_OR_DEPARTMENT_OTHER): Payer: Self-pay

## 2023-10-10 ENCOUNTER — Emergency Department (HOSPITAL_BASED_OUTPATIENT_CLINIC_OR_DEPARTMENT_OTHER)
Admission: EM | Admit: 2023-10-10 | Discharge: 2023-10-10 | Disposition: A | Discharge status: Home / Self Care | Attending: Emergency Medicine | Admitting: Emergency Medicine

## 2023-10-10 DIAGNOSIS — J029 Acute pharyngitis, unspecified: Secondary | ICD-10-CM | POA: Diagnosis not present

## 2023-10-10 DIAGNOSIS — R059 Cough, unspecified: Secondary | ICD-10-CM | POA: Diagnosis present

## 2023-10-10 LAB — RESP PANEL BY RT-PCR (RSV, FLU A&B, COVID)  RVPGX2
Influenza A by PCR: NEGATIVE
Influenza B by PCR: NEGATIVE
Resp Syncytial Virus by PCR: NEGATIVE
SARS Coronavirus 2 by RT PCR: NEGATIVE

## 2023-10-10 LAB — GROUP A STREP BY PCR: Group A Strep by PCR: NOT DETECTED

## 2023-10-10 MED ORDER — BENZONATATE 100 MG PO CAPS
100.0000 mg | ORAL_CAPSULE | Freq: Three times a day (TID) | ORAL | 0 refills | Status: AC
  Filled 2023-10-10: qty 21, 7d supply, fill #0

## 2023-10-10 NOTE — ED Notes (Signed)

## 2023-10-10 NOTE — Discharge Instructions (Addendum)
 Continue Mucinex as needed for cough.  Start Tessalon , 1 capsule by mouth every 8 hours as needed for cough; do not use this medication if you are pregnant.  Return to the emergency department if you experience chest pain or shortness of breath.  Follow-up with your primary care provider if your symptoms persist, I have provided you with the contact information for [facility], please contact their office to establish care since you do not have a primary care provider.

## 2023-10-10 NOTE — ED Triage Notes (Signed)
 Patient states states cough and sore throat for several days. States mother in the hospital sick right now and thinks she caught something from her.

## 2023-10-10 NOTE — ED Provider Notes (Signed)
 [place] EMERGENCY DEPARTMENT AT [facility] Provider Note   CSN: [id number] Arrival date & time: 10/10/23  1231     Patient presents with: Cough   Cynthia Williamson is a 28 y.o. female.   28 year old female presenting with cough and sore throat.  Symptoms have been ongoing since Wednesday, denies fever, chest pain, shortness of breath, lower extremity edema.  Reports productive cough with greenish sputum production.  She has been using Mucinex for relief of her symptoms which has helped.  No other complaints.   Cough      Prior to Admission medications   Medication Sig Start Date End Date Taking? Authorizing Provider  Vitamin D , Ergocalciferol , (DRISDOL ) 1.25 MG (50000 UNIT) CAPS capsule Take 1 capsule (50,000 Units total) by mouth every 7 (seven) days. 09/21/22   Wallace Joesph LABOR, PA    Allergies: Pork-derived products and Sulfa antibiotics    Review of Systems  Respiratory:  Positive for cough.     Updated Vital Signs  Vitals:   10/10/23 1255  BP: 125/87  Pulse: 89  Resp: 18  Temp: 98.5 F (36.9 C)  TempSrc: Oral  SpO2: 99%     Physical Exam Vitals and nursing note reviewed.  HENT:     Head: Normocephalic.     Mouth/Throat:     Mouth: Mucous membranes are moist.     Pharynx: No oropharyngeal exudate or posterior oropharyngeal erythema.  Eyes:     Extraocular Movements: Extraocular movements intact.  Cardiovascular:     Rate and Rhythm: Normal rate and regular rhythm.  Pulmonary:     Effort: Pulmonary effort is normal.     Breath sounds: Normal breath sounds.  Musculoskeletal:     Cervical back: Normal range of motion.     Comments: Moves all extremity spontaneously without difficulty  Lymphadenopathy:     Cervical: No cervical adenopathy.  Skin:    General: Skin is warm and dry.  Neurological:     Mental Status: She is alert and oriented to person, place, and time.     (all labs ordered are listed, but only abnormal results are  displayed) Labs Reviewed  RESP PANEL BY RT-PCR (RSV, FLU A&B, COVID)  [id number]  GROUP A STREP BY PCR    EKG: None  Radiology: No results found.   Procedures   Medications Ordered in the ED - No data to display                                  Medical Decision Making This patient presents to the ED for concern of cough, this involves an extensive number of treatment options, and is a complaint that carries with it a high risk of complications and morbidity.  The differential diagnosis includes COVID/flu/RSV, strep, other viral illness, pneumonia   Co morbidities that complicate the patient evaluation  History of anemia   Additional history obtained:  Additional history obtained from record review External records from outside source obtained and reviewed including most recent PCP note   Lab Tests:  I Ordered, and personally interpreted labs.  The pertinent results include: COVID/flu/RSV negative, strep PCR negative   Cardiac Monitoring: / EKG:  The patient was maintained on a cardiac monitor.  I personally viewed and interpreted the cardiac monitored which showed an underlying rhythm of: NSR   Problem List / ED Course / Critical interventions / Medication management I have reviewed the patients home medicines  and have made adjustments as needed   Test / Admission - Considered:  Physical exam is largely unremarkable, lungs are clear to auscultation bilaterally without adventitious sounds, normal S1/S2, oropharynx is without swelling/erythema/exudate.  Respiratory panel and strep negative as above.  Given reassuring findings as above, I do not feel that chest x-ray or further labs are warranted.  Advised patient that her symptoms are likely secondary to a viral illness, I encouraged her to continue Mucinex but will prescribe Tessalon  to be used as needed for cough.  Return precautions discussed.  I have provided her with the contact information for [facility] to establish care since she does not have a primary care provider.  She voiced understanding and is in agreement this plan.  She is appropriate for discharge at this time.    Risk Prescription drug management.        Final diagnoses:  Cough, unspecified type    ED Discharge Orders          Ordered    benzonatate  (TESSALON ) 100 MG capsule  Every 8 hours        10/10/23 1434               Glendia Rocky SAILOR, PA-C 10/10/23 1440    Emil Share, DO 10/10/23 1449
